# Patient Record
Sex: Male | Born: 1980 | Race: White | Hispanic: No | Marital: Single | Smoking: Current every day smoker
Health system: Southern US, Community
[De-identification: ages and names within clinical notes are randomized; demographics above are authoritative.]

---

## 2010-11-11 ENCOUNTER — Emergency Department (HOSPITAL_COMMUNITY)
Admission: EM | Admit: 2010-11-11 | Discharge: 2010-11-11 | Discharge status: Against Medical Advice | Payer: Self-pay | Attending: Emergency Medicine | Admitting: Emergency Medicine

## 2010-11-11 DIAGNOSIS — Z0389 Encounter for observation for other suspected diseases and conditions ruled out: Secondary | ICD-10-CM | POA: Insufficient documentation

## 2010-11-13 ENCOUNTER — Emergency Department (HOSPITAL_COMMUNITY)
Admission: EM | Admit: 2010-11-13 | Discharge: 2010-11-13 | Disposition: A | Discharge status: Home / Self Care | Payer: Self-pay | Attending: Emergency Medicine | Admitting: Emergency Medicine

## 2010-11-13 ENCOUNTER — Emergency Department (HOSPITAL_COMMUNITY): Payer: Self-pay

## 2010-11-13 DIAGNOSIS — R05 Cough: Secondary | ICD-10-CM | POA: Insufficient documentation

## 2010-11-13 DIAGNOSIS — R509 Fever, unspecified: Secondary | ICD-10-CM | POA: Insufficient documentation

## 2010-11-13 DIAGNOSIS — R059 Cough, unspecified: Secondary | ICD-10-CM | POA: Insufficient documentation

## 2010-11-13 DIAGNOSIS — R112 Nausea with vomiting, unspecified: Secondary | ICD-10-CM | POA: Insufficient documentation

## 2010-11-13 DIAGNOSIS — J40 Bronchitis, not specified as acute or chronic: Secondary | ICD-10-CM | POA: Insufficient documentation

## 2012-01-03 IMAGING — CR DG CHEST 2V
2 series · 2 of 2 positions shown · non-contrast
Comparison: None.

CLINICAL DATA: Chest pain, body aches, fever

CHEST - 2 VIEW

[view not recorded (1 of 2)]
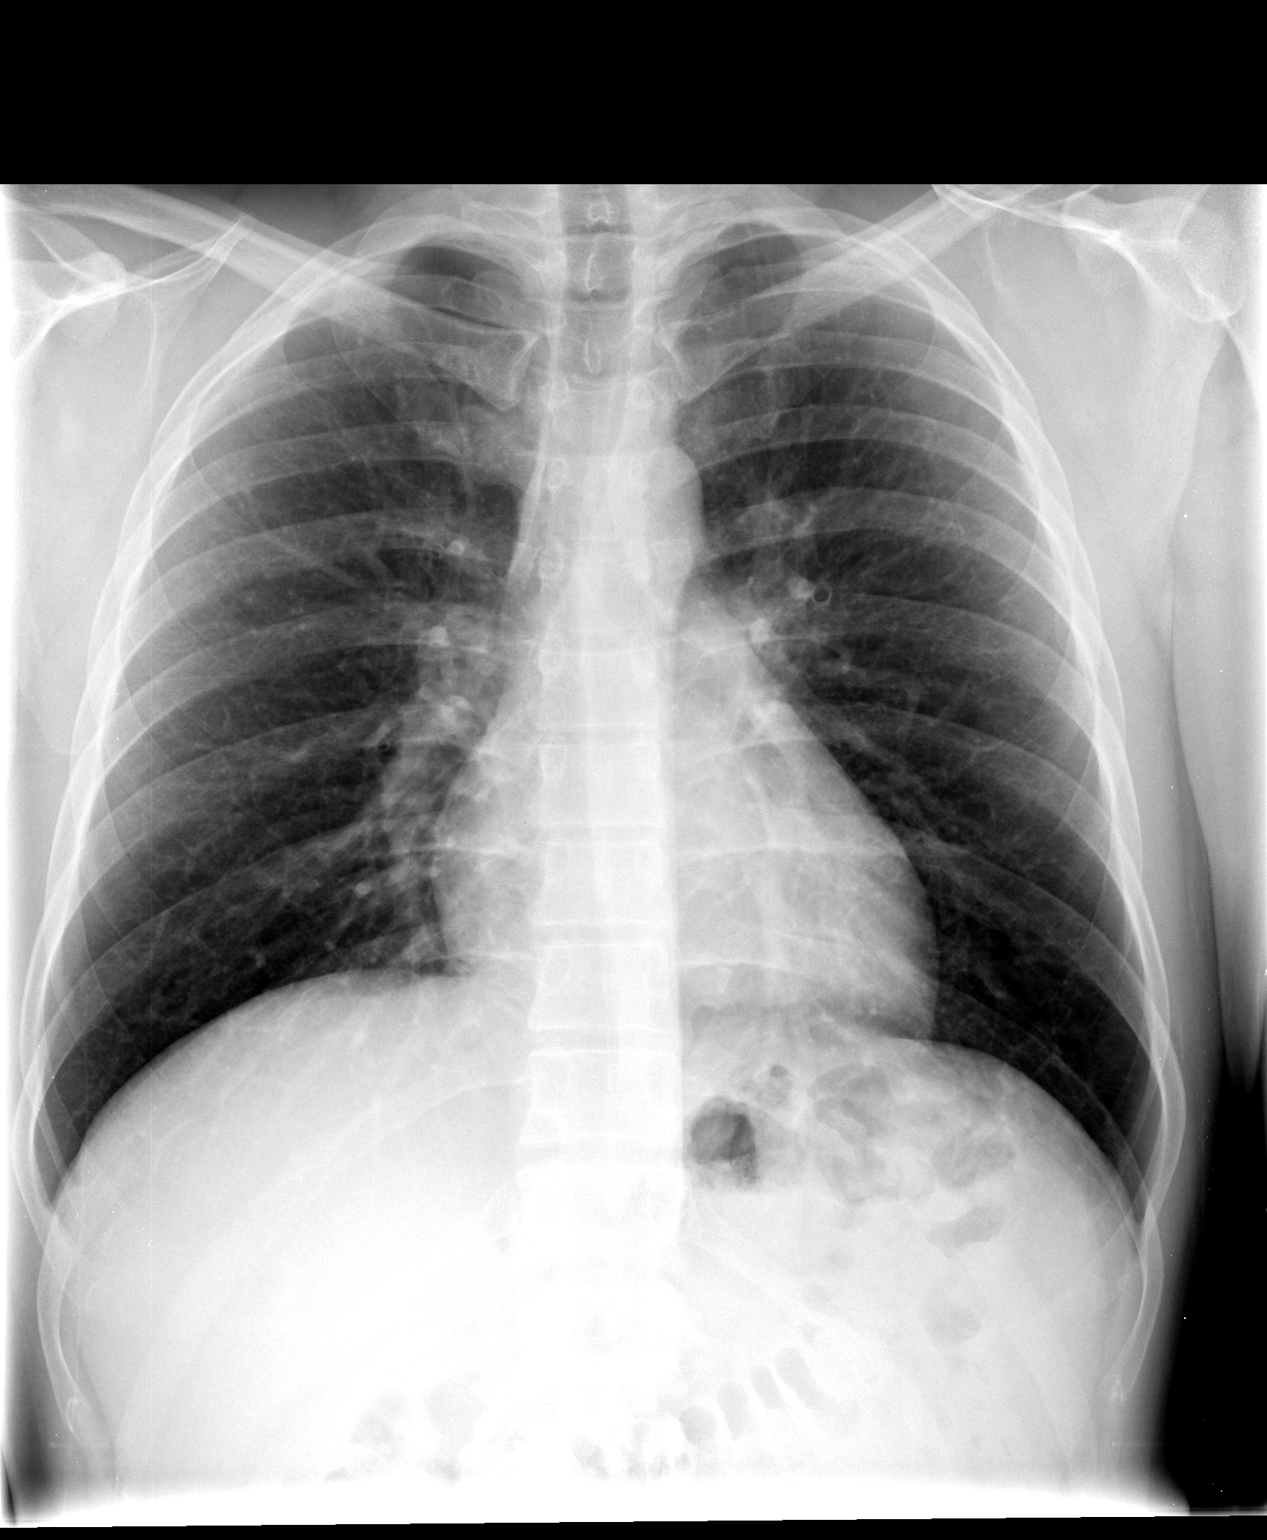

[view not recorded (2 of 2)]
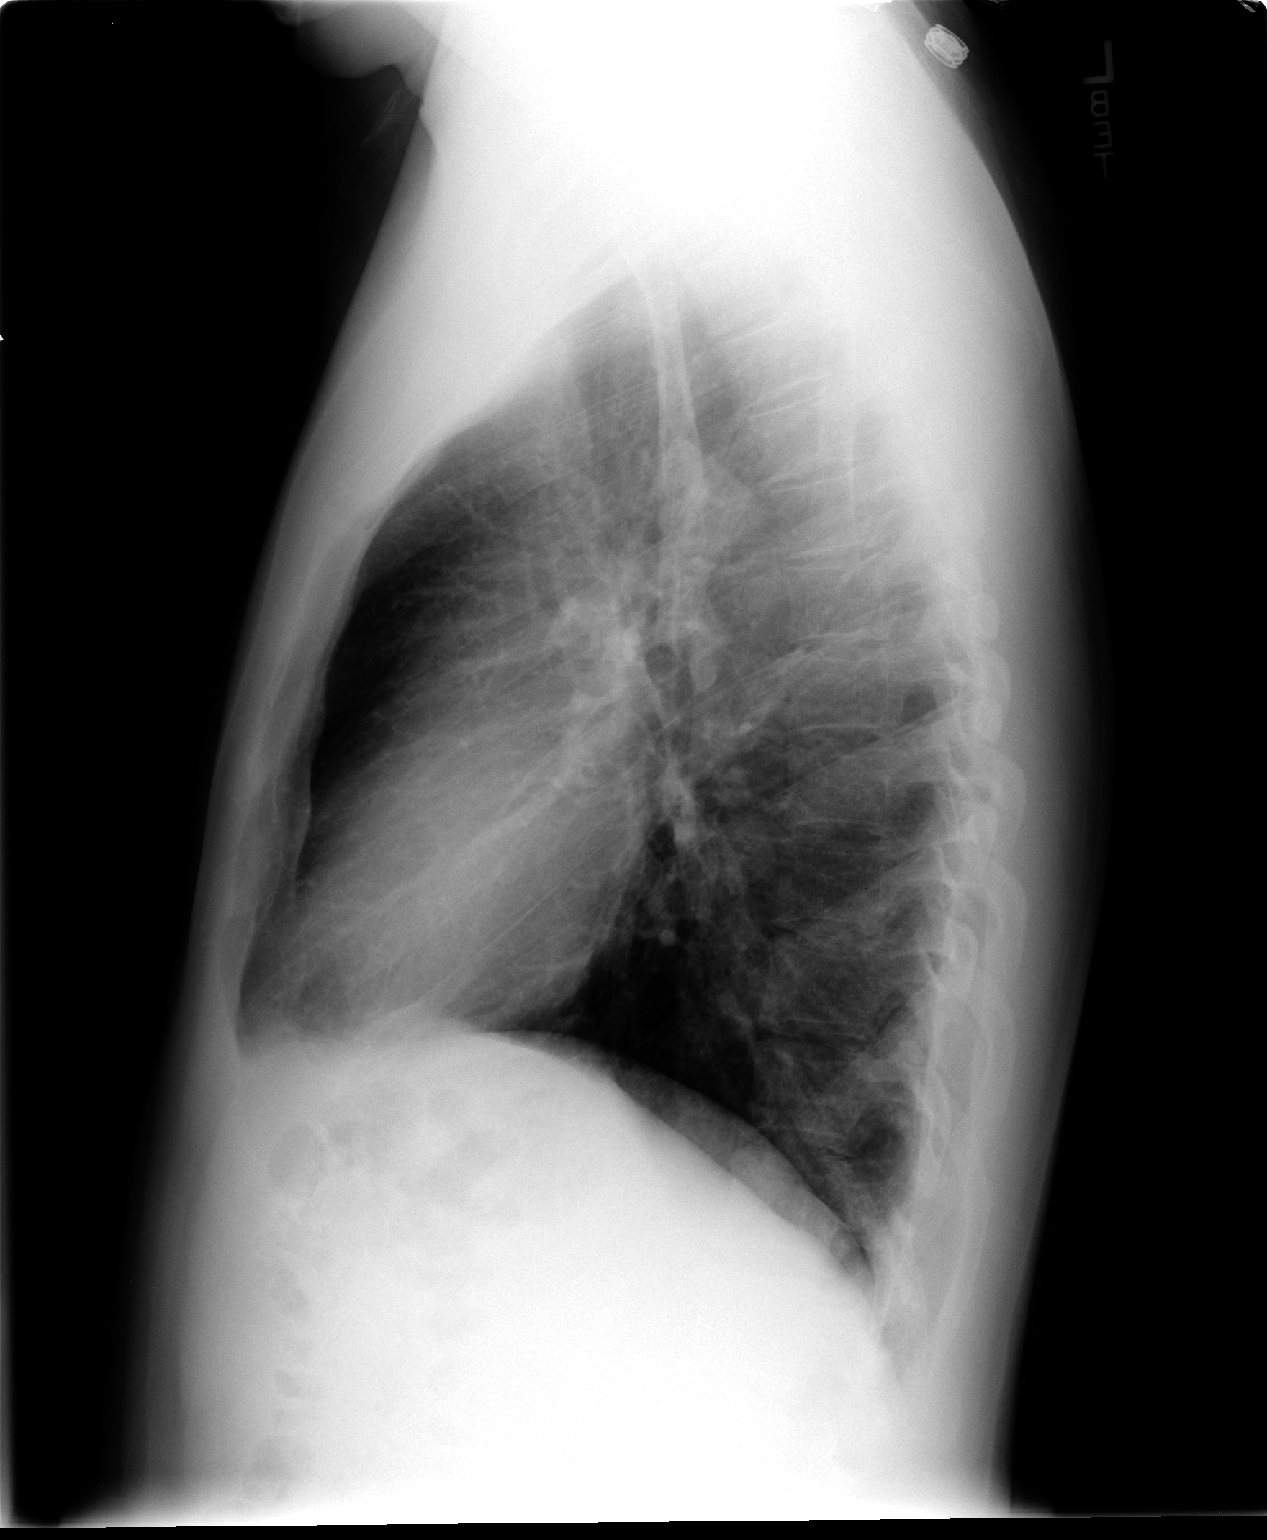

[2 of 2 positions shown; findings below may reference images not displayed]

FINDINGS: Lungs are clear. No pleural effusion or pneumothorax.

Cardiomediastinal silhouette is within normal limits.

Visualized osseous structures are within normal limits.
IMPRESSION: No evidence of acute cardiopulmonary disease.

## 2022-12-14 ENCOUNTER — Ambulatory Visit
Admission: EM | Admit: 2022-12-14 | Discharge: 2022-12-14 | Disposition: A | Discharge status: Home / Self Care | Payer: Self-pay | Attending: Nurse Practitioner | Admitting: Nurse Practitioner

## 2022-12-14 ENCOUNTER — Encounter: Payer: Self-pay | Admitting: Emergency Medicine

## 2022-12-14 DIAGNOSIS — R030 Elevated blood-pressure reading, without diagnosis of hypertension: Secondary | ICD-10-CM

## 2022-12-14 LAB — POCT URINALYSIS DIP (MANUAL ENTRY)
Bilirubin, UA: NEGATIVE
Glucose, UA: NEGATIVE mg/dL
Ketones, POC UA: NEGATIVE mg/dL
Leukocytes, UA: NEGATIVE
Nitrite, UA: NEGATIVE
Protein Ur, POC: NEGATIVE mg/dL
Spec Grav, UA: 1.02 (ref 1.010–1.025)
Urobilinogen, UA: 0.2 E.U./dL
pH, UA: 7 (ref 5.0–8.0)

## 2022-12-14 MED ORDER — AMLODIPINE BESYLATE 5 MG PO TABS: 5.0000 mg | ORAL_TABLET | Freq: Every day | ORAL | 0 refills | Status: AC

## 2022-12-14 NOTE — ED Triage Notes (Signed)
Here due to BP being high since last Wednesday.

## 2022-12-14 NOTE — ED Provider Notes (Signed)
[facility]    CSN: [id number] Arrival date & time: 12/14/22  1705      History   Chief Complaint No chief complaint on file.   HPI Cody Andrews is a 42 y.o. male.   The history is provided by the patient.   Patient presents for complaints of elevated blood pressure.  Patient states blood pressure was elevated while he was at work last week.  He states when he checked it, his blood pressure was in the 190's/130's.  Patient states over the past week, he has continued to check his blood pressure.  He states 1 day when he was at work, he checked it and it wasin the 150's/120's.  Patient states at that time, he did feel lightheaded.  Patient denies chest pain, shortness of breath, difficulty breathing, or lower extremity edema.  Patient reports that he does not have any past medical history, nor does he take any medications currently.  Patient reports that he does believe that his father had a history of high blood pressure, his father is now deceased.  He is unsure of what caused his father's death.  Patient states that he has not had any issues with his blood pressure in the past.  History reviewed. No pertinent past medical history.  There are no problems to display for this patient.   History reviewed. No pertinent surgical history.     Home Medications    Prior to Admission medications   Medication Sig Start Date End Date Taking? Authorizing Provider  amLODipine (NORVASC) 5 MG tablet Take 1 tablet (5 mg total) by mouth daily. 12/14/22  Yes Gloriann Riede-Warren, Sadie Haber, NP    Family History Family History  Problem Relation Age of Onset   Stroke Mother    Hypertension Father     Social History Social History   Tobacco Use   Smoking status: Every Day    Packs/day: 1    Types: Cigarettes   Smokeless tobacco: Never  Vaping Use   Vaping Use: Never used  Substance Use Topics   Alcohol use: Never   Drug use: Not Currently     Allergies   Patient has no  known allergies.   Review of Systems Review of Systems Per HPI  Physical Exam Triage Vital Signs ED Triage Vitals  Enc Vitals Group     BP 12/14/22 1711 (!) 191/92     Pulse Rate 12/14/22 1711 87     Resp 12/14/22 1711 18     Temp 12/14/22 1711 98.2 F (36.8 C)     Temp Source 12/14/22 1711 Oral     SpO2 12/14/22 1711 98 %     Weight --      Height --      Head Circumference --      Peak Flow --      Pain Score 12/14/22 1712 0     Pain Loc --      Pain Edu? --      Excl. in GC? --    No data found.  Updated Vital Signs BP (!) 184/110 (BP Location: Right Arm)   Pulse 87   Temp 98.2 F (36.8 C) (Oral)   Resp 18   SpO2 98%   Visual Acuity Right Eye Distance:   Left Eye Distance:   Bilateral Distance:    Right Eye Near:   Left Eye Near:    Bilateral Near:     Physical Exam Vitals and nursing note reviewed.  Constitutional:  General: He is not in acute distress.    Appearance: Normal appearance.  HENT:     Head: Normocephalic.  Eyes:     Extraocular Movements: Extraocular movements intact.     Pupils: Pupils are equal, round, and reactive to light.  Cardiovascular:     Rate and Rhythm: Normal rate and regular rhythm.     Pulses: Normal pulses.     Heart sounds: Normal heart sounds.  Pulmonary:     Effort: Pulmonary effort is normal. No respiratory distress.     Breath sounds: Normal breath sounds. No stridor. No wheezing, rhonchi or rales.  Abdominal:     General: Bowel sounds are normal.     Palpations: Abdomen is soft.     Tenderness: There is no abdominal tenderness.  Musculoskeletal:     Cervical back: Normal range of motion.     Right lower leg: No edema.     Left lower leg: No edema.  Lymphadenopathy:     Cervical: No cervical adenopathy.  Skin:    General: Skin is warm and dry.  Neurological:     General: No focal deficit present.     Mental Status: He is alert and oriented to person, place, and time.     GCS: GCS eye subscore is 4.  GCS verbal subscore is 5. GCS motor subscore is 6.     Cranial Nerves: Cranial nerves 2-12 are intact.     Sensory: Sensation is intact.     Motor: Motor function is intact.     Coordination: Coordination is intact.     Gait: Gait is intact.  Psychiatric:        Mood and Affect: Mood normal.        Behavior: Behavior normal.      UC Treatments / Results  Labs (all labs ordered are listed, but only abnormal results are displayed) Labs Reviewed  POCT URINALYSIS DIP (MANUAL ENTRY) - Abnormal; Notable for the following components:      Result Value   Blood, UA moderate (*)    All other components within normal limits  URINE CULTURE  BASIC METABOLIC PANEL  CBC WITH DIFFERENTIAL/PLATELET    EKG: NSR without ectopy, no STEMI, no other comparisons available   Radiology No results found.  Procedures Procedures (including critical care time)  Medications Ordered in UC Medications - No data to display  Initial Impression / Assessment and Plan / UC Course  I have reviewed the triage vital signs and the nursing notes.  Pertinent labs & imaging results that were available during my care of the patient were reviewed by me and considered in my medical decision making (see chart for details).  Patient presents for elevated blood pressure.  Patient is hypertensive, heart rate and O2 sat within normal limits.  Patient denies chest pain, shortness of breath, or difficulty breathing.  He does admit to an episode of lightheadedness.  EKG shows normal sinus rhythm without ectopy, no concern for STEMI.  Urinalysis is positive for blood.  BMP is pending along with a CBC for safety.  Repeat BP at discharge 184/110.  Given the patient's continued elevated blood pressure, will start him on amlodipine 5 mg while lab work is pending.  Supportive care recommendations were provided and discussed with the patient to include incorporating exercise into his daily routine, monitoring sodium intake, and  managing stress.  Will have patient begin keeping a headache diary and advised him to take this with him to when he is  able to establish care with a new provider.  Patient currently does not have a primary care physician.  Patient was given information for local primary care physician in this area to follow-up with and to establish care for continued management of patient's blood pressure.  Patient was given strict ER follow-up precautions.  Patient is in agreement with this plan of care and verbalizes understanding.  All questions were answered.  Patient stable for discharge.   Final Clinical Impressions(s) / UC Diagnoses   Final diagnoses:  Elevated blood pressure reading without diagnosis of hypertension     Discharge Instructions      A basic metabolic panel and complete blood count are pending.  Urinalysis does show blood, urine culture has been ordered.  You will be contacted if the results of the urine culture are abnormal..  Your EKG is normal today. Take medication as prescribed. Make sure you are drinking at least 8-10 8 ounce glasses of water daily. To help manage your elevated blood pressure, recommend beginning light physical activity such as walking or jogging for at least 10 to 15 minutes at least 3 days a week, then increasing the time and frequency. Monitor your sodium intake.  Preparing your all meals is the best way to control your sodium intake.  Please be mindful that fast food and processed food may have increased amounts of sodium. Try to manage her stress.  Recommend doing things that you enjoy to decrease stress. Begin keeping a diary of your blood pressures daily.  Recommend taking your blood pressure at random times during the day.  When she establish care with a physician, please take this information with you to that appointment. I would like for you to establish care with a primary care physician.  We are providing you information for a local primary care office in  the area who is excepting new patients.  Please call on 12/15/2022 to schedule an appointment. Go to the emergency department immediately if you develop change in your mental status, blurred vision, chest pain, shortness of breath, or difficulty breathing. Follow-up as needed.      ED Prescriptions     Medication Sig Dispense Auth. Provider   amLODipine (NORVASC) 5 MG tablet Take 1 tablet (5 mg total) by mouth daily. 30 tablet Ariyon Mittleman-Warren, Sadie Haber, NP      PDMP not reviewed this encounter.   Abran Cantor, NP 12/14/22 1755

## 2022-12-14 NOTE — Discharge Instructions (Addendum)
A basic metabolic panel and complete blood count are pending.  Urinalysis does show blood, urine culture has been ordered.  You will be contacted if the results of the urine culture are abnormal..  Your EKG is normal today. Take medication as prescribed. Make sure you are drinking at least 8-10 8 ounce glasses of water daily. To help manage your elevated blood pressure, recommend beginning light physical activity such as walking or jogging for at least 10 to 15 minutes at least 3 days a week, then increasing the time and frequency. Monitor your sodium intake.  Preparing your all meals is the best way to control your sodium intake.  Please be mindful that fast food and processed food may have increased amounts of sodium. Try to manage her stress.  Recommend doing things that you enjoy to decrease stress. Begin keeping a diary of your blood pressures daily.  Recommend taking your blood pressure at random times during the day.  When she establish care with a physician, please take this information with you to that appointment. I would like for you to establish care with a primary care physician.  We are providing you information for a local primary care office in the area who is excepting new patients.  Please call on 12/15/2022 to schedule an appointment. Go to the emergency department immediately if you develop change in your mental status, blurred vision, chest pain, shortness of breath, or difficulty breathing. Follow-up as needed.

## 2022-12-15 LAB — CBC WITH DIFFERENTIAL/PLATELET
Basophils Absolute: 0.1 10*3/uL (ref 0.0–0.2)
Basos: 0 %
EOS (ABSOLUTE): 0.2 10*3/uL (ref 0.0–0.4)
Eos: 2 %
Hematocrit: 44.2 % (ref 37.5–51.0)
Hemoglobin: 15.6 g/dL (ref 13.0–17.7)
Immature Grans (Abs): 0.1 10*3/uL (ref 0.0–0.1)
Immature Granulocytes: 1 %
Lymphocytes Absolute: 3.6 10*3/uL — ABNORMAL HIGH (ref 0.7–3.1)
Lymphs: 30 %
MCH: 30.6 pg (ref 26.6–33.0)
MCHC: 35.3 g/dL (ref 31.5–35.7)
MCV: 87 fL (ref 79–97)
Monocytes Absolute: 0.8 10*3/uL (ref 0.1–0.9)
Monocytes: 7 %
Neutrophils Absolute: 7.3 10*3/uL — ABNORMAL HIGH (ref 1.4–7.0)
Neutrophils: 60 %
Platelets: 335 10*3/uL (ref 150–450)
RBC: 5.1 x10E6/uL (ref 4.14–5.80)
RDW: 12 % (ref 11.6–15.4)
WBC: 12.1 10*3/uL — ABNORMAL HIGH (ref 3.4–10.8)

## 2022-12-15 LAB — URINE CULTURE: Culture: NO GROWTH

## 2022-12-15 LAB — BASIC METABOLIC PANEL
BUN/Creatinine Ratio: 8 — ABNORMAL LOW (ref 9–20)
BUN: 7 mg/dL (ref 6–24)
CO2: 27 mmol/L (ref 20–29)
Calcium: 9.9 mg/dL (ref 8.7–10.2)
Chloride: 101 mmol/L (ref 96–106)
Creatinine, Ser: 0.83 mg/dL (ref 0.76–1.27)
Glucose: 106 mg/dL — ABNORMAL HIGH (ref 70–99)
Potassium: 4.1 mmol/L (ref 3.5–5.2)
Sodium: 141 mmol/L (ref 134–144)
eGFR: 111 mL/min/{1.73_m2} (ref >59)
